# Patient Record
Sex: Male | Born: 1995 | Race: White | Hispanic: No | Marital: Single | Smoking: Never smoker
Health system: Southern US, Community
[De-identification: ages and names within clinical notes are randomized; demographics above are authoritative.]

## PROBLEM LIST (undated history)

## (undated) DIAGNOSIS — J939 Pneumothorax, unspecified: Secondary | ICD-10-CM

## (undated) DIAGNOSIS — D6851 Activated protein C resistance: Secondary | ICD-10-CM

## (undated) HISTORY — PX: APPENDECTOMY: SHX54

---
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
## 2018-01-27 ENCOUNTER — Observation Stay (HOSPITAL_COMMUNITY)
Admission: EM | Admit: 2018-01-27 | Discharge: 2018-01-29 | Disposition: A | Discharge status: Home / Self Care | Payer: 59 | Attending: Internal Medicine | Admitting: Internal Medicine

## 2018-01-27 ENCOUNTER — Other Ambulatory Visit: Payer: Self-pay

## 2018-01-27 ENCOUNTER — Encounter (HOSPITAL_COMMUNITY): Payer: Self-pay | Admitting: Emergency Medicine

## 2018-01-27 ENCOUNTER — Emergency Department (HOSPITAL_COMMUNITY): Payer: 59

## 2018-01-27 DIAGNOSIS — J9383 Other pneumothorax: Secondary | ICD-10-CM | POA: Diagnosis present

## 2018-01-27 DIAGNOSIS — D6851 Activated protein C resistance: Secondary | ICD-10-CM | POA: Diagnosis not present

## 2018-01-27 DIAGNOSIS — J9311 Primary spontaneous pneumothorax: Principal | ICD-10-CM | POA: Insufficient documentation

## 2018-01-27 HISTORY — DX: Pneumothorax, unspecified: J93.9

## 2018-01-27 HISTORY — DX: Activated protein C resistance: D68.51

## 2018-01-27 NOTE — ED Triage Notes (Signed)
C/o SOB and R sided back pain x 1 hour.  Pt had spontaneous pneumothorax August 13th and states symptoms feel the same.

## 2018-01-28 ENCOUNTER — Observation Stay (HOSPITAL_COMMUNITY): Payer: 59

## 2018-01-28 ENCOUNTER — Encounter (HOSPITAL_COMMUNITY): Payer: Self-pay | Admitting: Family Medicine

## 2018-01-28 ENCOUNTER — Other Ambulatory Visit: Payer: Self-pay

## 2018-01-28 DIAGNOSIS — J9311 Primary spontaneous pneumothorax: Secondary | ICD-10-CM

## 2018-01-28 DIAGNOSIS — D6851 Activated protein C resistance: Secondary | ICD-10-CM | POA: Insufficient documentation

## 2018-01-28 DIAGNOSIS — J939 Pneumothorax, unspecified: Secondary | ICD-10-CM | POA: Insufficient documentation

## 2018-01-28 LAB — CBC
HEMATOCRIT: 44.5 % (ref 39.0–52.0)
Hemoglobin: 14.5 g/dL (ref 13.0–17.0)
MCH: 29.6 pg (ref 26.0–34.0)
MCHC: 32.6 g/dL (ref 30.0–36.0)
MCV: 90.8 fL (ref 78.0–100.0)
Platelets: 332 10*3/uL (ref 150–400)
RBC: 4.9 MIL/uL (ref 4.22–5.81)
RDW: 12.2 % (ref 11.5–15.5)
WBC: 8.4 10*3/uL (ref 4.0–10.5)

## 2018-01-28 LAB — BASIC METABOLIC PANEL
Anion gap: 11 (ref 5–15)
BUN: 9 mg/dL (ref 6–20)
CALCIUM: 9.5 mg/dL (ref 8.9–10.3)
CO2: 24 mmol/L (ref 22–32)
Chloride: 107 mmol/L (ref 98–111)
Creatinine, Ser: 0.79 mg/dL (ref 0.61–1.24)
GFR calc non Af Amer: >60 mL/min (ref >60)
Glucose, Bld: 94 mg/dL (ref 70–99)
Potassium: 4.5 mmol/L (ref 3.5–5.1)
Sodium: 142 mmol/L (ref 135–145)

## 2018-01-28 LAB — HIV ANTIBODY (ROUTINE TESTING W REFLEX): HIV Screen 4th Generation wRfx: NONREACTIVE

## 2018-01-28 MED ORDER — SODIUM CHLORIDE 0.9% FLUSH
3.0000 mL | Freq: Two times a day (BID) | INTRAVENOUS | Status: DC
  Administered 2018-01-28 (×3): 3 mL via INTRAVENOUS

## 2018-01-28 MED ORDER — SODIUM CHLORIDE 0.9 % IV SOLN
INTRAVENOUS | Status: DC
  Administered 2018-01-28: 03:00:00 via INTRAVENOUS

## 2018-01-28 MED ORDER — ACETAMINOPHEN 650 MG RE SUPP: 650.0000 mg | Freq: Four times a day (QID) | RECTAL | Status: DC | PRN

## 2018-01-28 MED ORDER — ONDANSETRON HCL 4 MG PO TABS: 4.0000 mg | ORAL_TABLET | Freq: Four times a day (QID) | ORAL | Status: DC | PRN

## 2018-01-28 MED ORDER — HYDROCODONE-ACETAMINOPHEN 5-325 MG PO TABS: 1.0000 | ORAL_TABLET | ORAL | Status: DC | PRN

## 2018-01-28 MED ORDER — ACETAMINOPHEN 325 MG PO TABS: 650.0000 mg | ORAL_TABLET | Freq: Four times a day (QID) | ORAL | Status: DC | PRN

## 2018-01-28 MED ORDER — SENNOSIDES-DOCUSATE SODIUM 8.6-50 MG PO TABS: 1.0000 | ORAL_TABLET | Freq: Every evening | ORAL | Status: DC | PRN

## 2018-01-28 MED ORDER — ONDANSETRON HCL 4 MG/2ML IJ SOLN: 4.0000 mg | Freq: Four times a day (QID) | INTRAMUSCULAR | Status: DC | PRN

## 2018-01-28 NOTE — ED Provider Notes (Signed)
[facility] Provider Note   CSN: [id number] Arrival date & time: 01/27/18  2301     History   Chief Complaint Chief Complaint  Patient presents with  . Shortness of Breath    HPI Gavin Hanson is a 22 y.o. male.  Patient presents to the emergency department for sudden right-sided chest pain and shortness of breath.  Patient reports that he was admitted to the hospital in [place] on August 14 for spontaneous pneumothorax.  He reports that he had a chest tube for 4 or 5 days before he was discharged.  He tells me that his pneumothorax had nearly resolved at time of discharge, was approximately 5%.     Past Medical History:  Diagnosis Date  . Pneumothorax     There are no active problems to display for this patient.   Past Surgical History:  Procedure Laterality Date  . APPENDECTOMY          Home Medications    Prior to Admission medications   Not on File    Family History No family history on file.  Social History Social History   Tobacco Use  . Smoking status: Never Smoker  . Smokeless tobacco: Never Used  Substance Use Topics  . Alcohol use: Yes  . Drug use: Never     Allergies   Patient has no allergy information on record.   Review of Systems Review of Systems  Respiratory: Positive for shortness of breath.   Cardiovascular: Positive for chest pain.  All other systems reviewed and are negative.    Physical Exam Updated Vital Signs BP (!) 127/92   Pulse 76   Temp 98.2 F (36.8 C) (Oral)   Resp 18   SpO2 100%   Physical Exam  Constitutional: He is oriented to person, place, and time. He appears well-developed and well-nourished. No distress.  HENT:  Head: Normocephalic and atraumatic.  Right Ear: Hearing normal.  Left Ear: Hearing normal.  Nose: Nose normal.  Mouth/Throat: Oropharynx is clear and moist and mucous membranes are normal.  Eyes: Pupils are equal, round, and reactive to  light. Conjunctivae and EOM are normal.  Neck: Normal range of motion. Neck supple.  Cardiovascular: Regular rhythm, S1 normal and S2 normal. Exam reveals no gallop and no friction rub.  No murmur heard. Pulmonary/Chest: Effort normal and breath sounds normal. No respiratory distress. He exhibits no tenderness.  Abdominal: Soft. Normal appearance and bowel sounds are normal. There is no hepatosplenomegaly. There is no tenderness. There is no rebound, no guarding, no tenderness at McBurney's point and negative Murphy's sign. No hernia.  Musculoskeletal: Normal range of motion.  Neurological: He is alert and oriented to person, place, and time. He has normal strength. No cranial nerve deficit or sensory deficit. Coordination normal. GCS eye subscore is 4. GCS verbal subscore is 5. GCS motor subscore is 6.  Skin: Skin is warm, dry and intact. No rash noted. No cyanosis.  Psychiatric: He has a normal mood and affect. His speech is normal and behavior is normal. Thought content normal.  Nursing note and vitals reviewed.    ED Treatments / Results  Labs (all labs ordered are listed, but only abnormal results are displayed) Labs Reviewed - No data to display  EKG None  Radiology Dg Chest 2 View  Result Date: 01/27/2018 CLINICAL DATA:  Patient with shortness of breath and right-sided chest pain. History of spontaneous right pneumothorax. EXAM: CHEST - 2 VIEW COMPARISON:  None. FINDINGS:  Normal cardiac and mediastinal contours. No consolidative pulmonary opacities. No pleural effusion. Moderate right apical pneumothorax. Thoracic spine is unremarkable. IMPRESSION: Moderate right apical pneumothorax. Critical Value/emergent results were called by telephone at the time of interpretation on 01/27/2018 at 11:58 pm to Dr. Azalia Bilis , who verbally acknowledged these results. Electronically Signed   By: Annia Belt M.D.   On: 01/27/2018 23:59    Procedures Procedures (including critical care  time)  Medications Ordered in ED Medications - No data to display   Initial Impression / Assessment and Plan / ED Course  I have reviewed the triage vital signs and the nursing notes.  Pertinent labs & imaging results that were available during my care of the patient were reviewed by me and considered in my medical decision making (see chart for details).     Patient presents with sudden right-sided chest pain and shortness of breath.  He appears comfortable, vital signs are normal.  Normal oxygenation.  Chest x-ray shows moderate right apical pneumothorax.  This seems to be a sudden increase from his discharge, however, cannot currently see records through care everywhere.  This was discussed with Dr. Cornelius Moras, on-call for cardiothoracic surgery.  He has reviewed the x-ray, does not think the patient needs a pneumothorax at this time.  Based on the recent history, however, recommends hospitalization on the hospitalist service and repeat chest x-ray in the morning.  Dr. Cornelius Moras will follow.  Final Clinical Impressions(s) / ED Diagnoses   Final diagnoses:  Spontaneous pneumothorax    ED Discharge Orders    None       Pollina, Canary Brim, MD 01/28/18 [phone]

## 2018-01-28 NOTE — Consult Note (Signed)
[address]       [place] [address]             [phone]          CARDIOTHORACIC SURGERY CONSULTATION REPORT  PCP is Patient, No Pcp Per Referring Provider is Odie Sera, MD   Reason for consultation:  Spontaneous pneumothorax  HPI:  Patient is a 22 year old male with medical history notable for factor V Leiden who has been referred for evaluation of recurrent/persistent spontaneous pneumothorax.  Patient was hospitalized in [place] 2 weeks ago with large right primary spontaneous pneumothorax.  There was no previous history of spontaneous pneumothorax and no preceding history of trauma.  A chest tube was placed and the patient hospitalized for several days.  The chest tube was removed uneventfully although according to the patient's mother there was residual 10% pneumothorax.  The patient was seen in follow-up last week where a small pneumothorax persisted but was reportedly improved.  The patient was traveling back to [place] where he attends [employer] when he developed recurrent symptoms of chest pain and mild shortness of breath.  He presented to the emergency department where chest x-ray revealed small pneumothorax.  The patient was hospitalized and cardiothoracic surgical consultation was requested.  Patient reports that he already feels better.  He denies any current symptoms of chest discomfort or shortness of breath.  He is hungry.  Past Medical History:  Diagnosis Date  . Factor V Leiden (HCC)   . Pneumothorax     Past Surgical History:  Procedure Laterality Date  . APPENDECTOMY      Family History  Problem Relation Age of Onset  . Factor V Leiden deficiency Father     Social History   Socioeconomic History  . Marital status: Single    Spouse name: Not on file  . Number of children: Not on file  . Years of education: Not on file  . Highest education level: Not on file  Occupational History  . Not on file    Social Needs  . Financial resource strain: Not on file  . Food insecurity:    Worry: Not on file    Inability: Not on file  . Transportation needs:    Medical: Not on file    Non-medical: Not on file  Tobacco Use  . Smoking status: Never Smoker  . Smokeless tobacco: Never Used  Substance and Sexual Activity  . Alcohol use: Yes  . Drug use: Never  . Sexual activity: Not on file  Lifestyle  . Physical activity:    Days per week: Not on file    Minutes per session: Not on file  . Stress: Not on file  Relationships  . Social connections:    Talks on phone: Not on file    Gets together: Not on file    Attends religious service: Not on file    Active member of club or organization: Not on file    Attends meetings of clubs or organizations: Not on file    Relationship status: Not on file  . Intimate partner violence:    Fear of current or ex partner: Not on file    Emotionally abused: Not on file    Physically abused: Not on file    Forced sexual activity: Not on file  Other Topics Concern  . Not on file  Social History Narrative  . Not on file    Prior to Admission medications  Not on File    Current Facility-Administered Medications  Medication Dose Route Frequency Provider Last Rate Last Dose  . acetaminophen (TYLENOL) tablet 650 mg  650 mg Oral Q6H PRN Opyd, Lavone Neri, MD       Or  . acetaminophen (TYLENOL) suppository 650 mg  650 mg Rectal Q6H PRN Opyd, Lavone Neri, MD      . HYDROcodone-acetaminophen (NORCO/VICODIN) 5-325 MG per tablet 1-2 tablet  1-2 tablet Oral Q4H PRN Opyd, Lavone Neri, MD      . ondansetron (ZOFRAN) tablet 4 mg  4 mg Oral Q6H PRN Opyd, Lavone Neri, MD       Or  . ondansetron (ZOFRAN) injection 4 mg  4 mg Intravenous Q6H PRN Opyd, Lavone Neri, MD      . senna-docusate (Senokot-S) tablet 1 tablet  1 tablet Oral QHS PRN Opyd, Lavone Neri, MD      . sodium chloride flush (NS) 0.9 % injection 3 mL  3 mL Intravenous Q12H Opyd, Lavone Neri, MD   3 mL at  01/28/18 0945    No Known Allergies    Review of Systems:  Per HPI - remainder non-contributory     Physical Exam:   BP 116/88 (BP Location: Right Arm)   Pulse (!) 55   Temp 97.6 F (36.4 C) (Oral)   Resp 20   Ht 6\' 1"  (1.854 m)   Wt 60.8 kg   SpO2 100%   BMI 17.68 kg/m   General:  Thin,  well-appearing  HEENT:  Unremarkable   Neck:   no JVD, no bruits, no adenopathy   Chest:   clear to auscultation, slightly diminished breath sounds on right side, no wheezes, no rhonchi   CV:   RRR, no  murmur   Abdomen:  soft, non-tender, no masses   Extremities:  warm, well-perfused, pulses palpable, no lower extremity edema  Rectal/GU  Deferred  Neuro:   Grossly non-focal and symmetrical throughout  Skin:   Clean and dry, no rashes, no breakdown  Diagnostic Tests:  Lab Results: Recent Labs    01/28/18 0100  WBC 8.4  HGB 14.5  HCT 44.5  PLT 332   BMET:  Recent Labs    01/28/18 0100  NA 142  K 4.5  CL 107  CO2 24  GLUCOSE 94  BUN 9  CREATININE 0.79  CALCIUM 9.5    CBG (last 3)  No results for input(s): GLUCAP in the last 72 hours. PT/INR:  No results for input(s): LABPROT, INR in the last 72 hours.  CXR:  CHEST - 2 VIEW  COMPARISON:  None.  FINDINGS: Normal cardiac and mediastinal contours. No consolidative pulmonary opacities. No pleural effusion. Moderate right apical pneumothorax. Thoracic spine is unremarkable.  IMPRESSION: Moderate right apical pneumothorax.  Critical Value/emergent results were called by telephone at the time of interpretation on 01/27/2018 at 11:58 pm to Dr. Azalia Bilis , who verbally acknowledged these results.   Electronically Signed   By: Annia Belt M.D.   On: 01/27/2018 23:59  CHEST - 2 VIEW  COMPARISON:  Yesterday  FINDINGS: Small right apical pneumothorax with mild increase from prior. At the apex craniocaudal span was previously 27 mm and is today 35 mm. Gas also extends more laterally. There is no  shift. The lungs are clear.  IMPRESSION: Mild increase in small right apical pneumothorax.   Electronically Signed   By: Marnee Spring M.D.   On: 01/28/2018 08:14   Impression:  Recurrent/persistent right primary spontaneous  pneumothorax.  I have personally reviewed the patient's admission chest x-ray and a repeat x-ray performed this morning.  There is a small right pneumothorax without any complicating features.  In and of itself this pneumothorax is not large enough to mandate surgical intervention at this time.  I am not convinced that the pneumothorax has changed in size overnight, as the difference between the 2 films appears likely related to depth of inspiration.  It is unclear whether or not the patient's pneumothorax represents persistence of the his previous spontaneous pneumothorax versus a recurrence, although according to the mother the patient had a follow-up x-ray performed earlier last week that showed a persistent small right pneumothorax.   Plan:  I discussed options at length with the patient and his mother at the bedside this morning.  Options at this time include continued observation versus chest tube placement versus definitive surgical management with video-assisted thoracoscopic surgery for bleb resection and pleurodesis.  I think it would be a bit aggressive to proceed with surgery at this time and chest tube placement does not appear necessary.  I favor continued observation.  If the patient's pneumothorax increases some type of intervention will be required.  The patient's circumstances are further complicated by the fact that he and his family live in [place] he is currently [place] in [place] at [employer].    We plan repeat chest x-ray tomorrow morning.  If x-ray tomorrow remains stable I think it would be reasonable to discharge the patient with plans for follow-up chest x-ray sometime later in the week.  The patient  understands that he should not be driving an automobile and he should not take part in any type of strenuous physical exertion for at least 2 more weeks no matter how good he feels.  All questions answered.  I spent in excess of 60 minutes during the conduct of this hospital consultation and >50% of this time involved direct face-to-face encounter for counseling and/or coordination of the patient's care.    Salvatore Decentlarence H. Cornelius Moraswen, MD 01/28/2018 11:39 AM

## 2018-01-28 NOTE — Progress Notes (Signed)
Patient admitted after midnight, please see H&P.  2nd pneumothorax in last few weeks.  ? marfans-- long thorax, tall/lanky.  Never had echo or other work up.  Xray today not improved-- await CVTS evaluation and recommendations.   No pain, no SOB.   Marlin CanaryJessica Merlie Noga DO

## 2018-01-28 NOTE — ED Notes (Signed)
ED Provider at bedside. 

## 2018-01-28 NOTE — H&P (Signed)
History and Physical    Ivy Meriwether ZOX:[id number] DOB: 01/05/1996 DOA: 01/27/2018  PCP: No primary care provider on file.   Patient coming from: Home   Chief Complaint: Acute chest pain and SOB   HPI: Gavin Hanson is a 22 y.o. male with medical history significant for factor V Leiden, now presenting to the emergency department for evaluation of acute onset of right-sided chest pain and shortness of breath.  Patient reports that he was admitted to hospital in [place] approximately 2 weeks ago with his first spontaneous pneumothorax, had chest tube placed for 4 to 5 days, and was discharged home in his usual condition with an approximate 5% residual pneumothorax.  He had continued to feel well at home with no recent coughing, and was seated at rest when he developed acute onset of pain in the right chest and shortness of breath, similar to symptoms that he experienced with the initial pneumothorax 2 weeks ago.  He denies any smoking history and denies history of asthma.  He has not been coughing or vomiting.  Reports that he usually enjoys good health and does not take any medications.  ED Course: Upon arrival to the ED, patient is found to be afebrile, saturating well on room air, and with vitals otherwise normal.  EKG features normal sinus rhythm and chemistry panel is unremarkable.  Chest x-ray reveals a moderate apical right-sided pneumothorax.  CT surgery was consulted by the ED physician and recommended a medical admission with repeat x-ray in the morning, but no aspiration or chest tube at this time.  Review of Systems:  All other systems reviewed and apart from HPI, are negative.  Past Medical History:  Diagnosis Date  . Factor V Leiden (HCC)   . Pneumothorax     Past Surgical History:  Procedure Laterality Date  . APPENDECTOMY       reports that he has never smoked. He has never used smokeless tobacco. He reports that he drinks alcohol. He reports that he does not use  drugs.  No Known Allergies  Family History  Problem Relation Age of Onset  . Factor V Leiden deficiency Father      Prior to Admission medications   Not on File    Physical Exam: Vitals:   01/27/18 2306 01/28/18 0015 01/28/18 0045 01/28/18 0135  BP: 124/87 (!) 127/92 118/81 116/88  Pulse: 90 76 65 (!) 55  Resp: 16 18 18 20   Temp: 98.2 F (36.8 C)   97.6 F (36.4 C)  TempSrc: Oral   Oral  SpO2: 100% 100% 100% 100%  Weight:    60.8 kg  Height:    6\' 1"  (1.854 m)      Constitutional: NAD, calm  Eyes: PERTLA, lids and conjunctivae normal ENMT: Mucous membranes are moist. Posterior pharynx clear of any exudate or lesions.   Neck: normal, supple, no masses, no thyromegaly Respiratory: Diminished on right, no wheezing, no crackles. Normal respiratory effort.    Cardiovascular: S1 & S2 heard, regular rate and rhythm,. No extremity edema.  Abdomen: No distension, no tenderness, soft. Bowel sounds normal.  Musculoskeletal: no clubbing / cyanosis. No joint deformity upper and lower extremities.    Skin: no significant rashes, lesions, ulcers. Warm, dry, well-perfused. Neurologic: CN 2-12 grossly intact. Sensation intact. Strength 5/5 in all 4 limbs.  Psychiatric: Alert and oriented x 3. Calm, cooperative.     Labs on Admission: I have personally reviewed following labs and imaging studies  CBC: No results for input(s): WBC,  NEUTROABS, HGB, HCT, MCV, PLT in the last 168 hours. Basic Metabolic Panel: Recent Labs  Lab 01/28/18 0100  NA 142  K 4.5  CL 107  CO2 24  GLUCOSE 94  BUN 9  CREATININE 0.79  CALCIUM 9.5   GFR: Estimated Creatinine Clearance: 125.6 mL/min (by C-G formula based on SCr of 0.79 mg/dL). Liver Function Tests: No results for input(s): AST, ALT, ALKPHOS, BILITOT, PROT, ALBUMIN in the last 168 hours. No results for input(s): LIPASE, AMYLASE in the last 168 hours. No results for input(s): AMMONIA in the last 168 hours. Coagulation Profile: No  results for input(s): INR, PROTIME in the last 168 hours. Cardiac Enzymes: No results for input(s): CKTOTAL, CKMB, CKMBINDEX, TROPONINI in the last 168 hours. BNP (last 3 results) No results for input(s): PROBNP in the last 8760 hours. HbA1C: No results for input(s): HGBA1C in the last 72 hours. CBG: No results for input(s): GLUCAP in the last 168 hours. Lipid Profile: No results for input(s): CHOL, HDL, LDLCALC, TRIG, CHOLHDL, LDLDIRECT in the last 72 hours. Thyroid Function Tests: No results for input(s): TSH, T4TOTAL, FREET4, T3FREE, THYROIDAB in the last 72 hours. Anemia Panel: No results for input(s): VITAMINB12, FOLATE, FERRITIN, TIBC, IRON, RETICCTPCT in the last 72 hours. Urine analysis: No results found for: COLORURINE, APPEARANCEUR, LABSPEC, PHURINE, GLUCOSEU, HGBUR, BILIRUBINUR, KETONESUR, PROTEINUR, UROBILINOGEN, NITRITE, LEUKOCYTESUR Sepsis Labs: @LABRCNTIP (procalcitonin:4,lacticidven:4) )No results found for this or any previous visit (from the past 240 hour(s)).   Radiological Exams on Admission: Dg Chest 2 View  Result Date: 01/27/2018 CLINICAL DATA:  Patient with shortness of breath and right-sided chest pain. History of spontaneous right pneumothorax. EXAM: CHEST - 2 VIEW COMPARISON:  None. FINDINGS: Normal cardiac and mediastinal contours. No consolidative pulmonary opacities. No pleural effusion. Moderate right apical pneumothorax. Thoracic spine is unremarkable. IMPRESSION: Moderate right apical pneumothorax. Critical Value/emergent results were called by telephone at the time of interpretation on 01/27/2018 at 11:58 pm to Dr. Azalia BilisKEVIN CAMPOS , who verbally acknowledged these results. Electronically Signed   By: Annia Beltrew  Davis M.D.   On: 01/27/2018 23:59    EKG: Independently reviewed. Sinus rhythm, non-specific ST abnormalities, no priors available.   Assessment/Plan   1. Spontaneous pneumothorax  - Presents with acute-onset of right-sided CP and SOB while at rest and  is found to have moderate right apical pneumothorax on CXR  - He had been admitted to a hospital in [place] 2 wks ago for the same, reports having chest tube in for 4 or 5 days and being discharged with 5% residual PTX   - He has remained stable with normal respiratory and heart rate, normal BP, no hypoxia, and speaking full sentences  - CTS is consulting and much appreciated  - Repeat 2v CXR in am, continue supportive care    DVT prophylaxis: SCD's  Code Status: Full  Family Communication: Discussed with patient  Consults called: CTS  Admission status: Observation     Briscoe Deutscherimothy S Opyd, MD [facility] Pager [phone]  If 7PM-7AM, please contact night-coverage [email or web address] Password [email or web address]  01/28/2018, 2:12 AM

## 2018-01-29 ENCOUNTER — Observation Stay (HOSPITAL_COMMUNITY): Payer: 59

## 2018-01-29 DIAGNOSIS — J9311 Primary spontaneous pneumothorax: Secondary | ICD-10-CM | POA: Diagnosis not present

## 2018-01-29 NOTE — Progress Notes (Signed)
      [address]       [place] [address]             [phone]     CARDIOTHORACIC SURGERY PROGRESS NOTE  Subjective: No complaints.  No SOB.  Mild right sided chest discomfort with deep inspiration  Objective: Vital signs in last 24 hours: Temp:  [97.8 F (36.6 C)-98.3 F (36.8 C)] 97.8 F (36.6 C) (08/31 2209) Pulse Rate:  [44-67] 44 (08/31 2209) Cardiac Rhythm: Normal sinus rhythm (08/31 1951) Resp:  [16] 16 (08/31 1749) BP: (93-101)/(55-61) 93/55 (08/31 2209) SpO2:  [98 %-99 %] 98 % (08/31 2209)  Physical Exam:  Rhythm:   sinus  Breath sounds: Clear, slightly diminished on right side  Heart sounds:  RRR  Incisions:  n/a  Abdomen:  soft  Extremities:  warm   Intake/Output from previous day: 08/31 0701 - 09/01 0700 In: 3 [I.V.:3] Out: -  Intake/Output this shift: No intake/output data recorded.  Lab Results: Recent Labs    01/28/18 0100  WBC 8.4  HGB 14.5  HCT 44.5  PLT 332   BMET:  Recent Labs    01/28/18 0100  NA 142  K 4.5  CL 107  CO2 24  GLUCOSE 94  BUN 9  CREATININE 0.79  CALCIUM 9.5    CBG (last 3)  No results for input(s): GLUCAP in the last 72 hours. PT/INR:  No results for input(s): LABPROT, INR in the last 72 hours.  CXR:  CHEST - 2 VIEW  COMPARISON:  Chest radiograph dated 01/28/2018  FINDINGS: No significant interval change in the size of the right apical pneumothorax compared to the earlier radiograph. There is however additional pneumothorax involving the inferior right lung and along the right hemidiaphragm which is new or larger since the prior radiograph. No pneumothorax on the left. There is no interval shift of the mediastinum. No focal consolidation. No acute osseous pathology.  IMPRESSION: 1. Similar size right apical pneumothorax. 2. Right lung base pneumothorax along the right hemidiaphragm appears larger or more conspicuous compared to the earlier radiograph.  Electronically  Signed: By: Elgie Collard M.D. On: 01/29/2018 05:46   Assessment/Plan:   Essentially no change in small persistent/recurrent right pneumothorax.  Options again discussed at length with patient and his mother at the bedside.  I recommend continued observation without any type of surgical intervention at this time, but I quoted them a 50% likelihood that the pneumothorax might enlarge further and ultimately require intervention.  Given the patient's stability since presentation 2 days ago, I think it is reasonable to let him go home as long as he can be followed closely as an outpatient.  I recommend repeat CXR in 2-3 days or sooner if symptoms worsen.  The patient should not be driving an automobile and should be taking it easy with absolutely no strenuous activity of any kind.  Under the circumstances, I think it makes the most sense for the patient to go back home to [place] where he could follow up this week with the thoracic surgeon he saw previously.  All questions answered.   I spent in excess of 30 minutes during the conduct of this hospital encounter and >50% of this time involved direct face-to-face encounter with the patient for counseling and/or coordination of their care.    Gavin Nails, MD 01/29/2018 7:32 AM

## 2018-01-29 NOTE — Discharge Summary (Signed)
Physician Discharge Summary  Gavin Hanson ZOX:[id number] DOB: Oct 15, 1995 DOA: 01/27/2018  PCP: Patient, No Pcp Per  Admit date: 01/27/2018 Discharge date: 01/29/2018  Admitted From: home Discharge disposition: home   Recommendations for Outpatient Follow-Up:   recommend repeat CXR in 2-3 days or sooner if symptoms worsen.  The patient should not be driving an automobile and should be taking it easy with absolutely no strenuous activity of any kind.  Under the circumstances, I think it makes the most sense for the patient to go back home to [place] where he could follow up this week with the thoracic surgeon he saw previously. -? Marfans work up -? Alpha 1 antitrypsin   Discharge Diagnosis:   Principal Problem:   Primary spontaneous pneumothorax    Discharge Condition: Improved.  Diet recommendation: Regular.  Wound care: None.  Code status: Full.   History of Present Illness:   Gavin Hanson is a 22 y.o. male with medical history significant for factor V Leiden, now presenting to the emergency department for evaluation of acute onset of right-sided chest pain and shortness of breath.  Patient reports that he was admitted to hospital in [place] approximately 2 weeks ago with his first spontaneous pneumothorax, had chest tube placed for 4 to 5 days, and was discharged home in his usual condition with an approximate 5% residual pneumothorax.  He had continued to feel well at home with no recent coughing, and was seated at rest when he developed acute onset of pain in the right chest and shortness of breath, similar to symptoms that he experienced with the initial pneumothorax 2 weeks ago.  He denies any smoking history and denies history of asthma.  He has not been coughing or vomiting.  Reports that he usually enjoys good health and does not take any medications.   Hospital Course by Problem:   Per CVTSCornelius Moras Essentially no change in small persistent/recurrent  right pneumothorax.  Options again discussed at length with patient and his mother at the bedside.  I recommend continued observation without any type of surgical intervention at this time, but I quoted them a 50% likelihood that the pneumothorax might enlarge further and ultimately require intervention.  Given the patient's stability since presentation 2 days ago, I think it is reasonable to let him go home as long as he can be followed closely as an outpatient.  I recommend repeat CXR in 2-3 days or sooner if symptoms worsen.  The patient should not be driving an automobile and should be taking it easy with absolutely no strenuous activity of any kind.  Under the circumstances, I think it makes the most sense for the patient to go back home to [place] where he could follow up this week with the thoracic surgeon he saw previously.    Medical Consultants:    CVTS  Discharge Exam:   Vitals:   01/28/18 2209 01/29/18 0751  BP: (!) 93/55 (!) 95/58  Pulse: (!) 44 (!) 54  Resp:  16  Temp: 97.8 F (36.6 C) (!) 97.5 F (36.4 C)  SpO2: 98% 99%   Vitals:   01/28/18 0135 01/28/18 1749 01/28/18 2209 01/29/18 0751  BP: 116/88 101/61 (!) 93/55 (!) 95/58  Pulse: (!) 55 67 (!) 44 (!) 54  Resp: 20 16  16   Temp: 97.6 F (36.4 C) 98.3 F (36.8 C) 97.8 F (36.6 C) (!) 97.5 F (36.4 C)  TempSrc: Oral Oral Oral Oral  SpO2: 100% 99% 98% 99%  Weight: 60.8 kg     Height: 6\' 1"  (1.854 m)       General exam: Appears calm and comfortable. Anxious to go home  The results of significant diagnostics from this hospitalization (including imaging, microbiology, ancillary and laboratory) are listed below for reference.     Procedures and Diagnostic Studies:   Dg Chest 2 View  Result Date: 01/28/2018 CLINICAL DATA:  Spontaneous pneumothorax EXAM: CHEST - 2 VIEW COMPARISON:  Yesterday FINDINGS: Small right apical pneumothorax with mild increase from prior. At the apex craniocaudal span was previously 27  mm and is today 35 mm. Gas also extends more laterally. There is no shift. The lungs are clear. IMPRESSION: Mild increase in small right apical pneumothorax. Electronically Signed   By: Marnee Spring M.D.   On: 01/28/2018 08:14   Dg Chest 2 View  Result Date: 01/27/2018 CLINICAL DATA:  Patient with shortness of breath and right-sided chest pain. History of spontaneous right pneumothorax. EXAM: CHEST - 2 VIEW COMPARISON:  None. FINDINGS: Normal cardiac and mediastinal contours. No consolidative pulmonary opacities. No pleural effusion. Moderate right apical pneumothorax. Thoracic spine is unremarkable. IMPRESSION: Moderate right apical pneumothorax. Critical Value/emergent results were called by telephone at the time of interpretation on 01/27/2018 at 11:58 pm to Dr. Azalia Bilis , who verbally acknowledged these results. Electronically Signed   By: Annia Belt M.D.   On: 01/27/2018 23:59     Labs:   Basic Metabolic Panel: Recent Labs  Lab 01/28/18 0100  NA 142  K 4.5  CL 107  CO2 24  GLUCOSE 94  BUN 9  CREATININE 0.79  CALCIUM 9.5   GFR Estimated Creatinine Clearance: 125.6 mL/min (by C-G formula based on SCr of 0.79 mg/dL). Liver Function Tests: No results for input(s): AST, ALT, ALKPHOS, BILITOT, PROT, ALBUMIN in the last 168 hours. No results for input(s): LIPASE, AMYLASE in the last 168 hours. No results for input(s): AMMONIA in the last 168 hours. Coagulation profile No results for input(s): INR, PROTIME in the last 168 hours.  CBC: Recent Labs  Lab 01/28/18 0100  WBC 8.4  HGB 14.5  HCT 44.5  MCV 90.8  PLT 332   Cardiac Enzymes: No results for input(s): CKTOTAL, CKMB, CKMBINDEX, TROPONINI in the last 168 hours. BNP: Invalid input(s): POCBNP CBG: No results for input(s): GLUCAP in the last 168 hours. D-Dimer No results for input(s): DDIMER in the last 72 hours. Hgb A1c No results for input(s): HGBA1C in the last 72 hours. Lipid Profile No results for  input(s): CHOL, HDL, LDLCALC, TRIG, CHOLHDL, LDLDIRECT in the last 72 hours. Thyroid function studies No results for input(s): TSH, T4TOTAL, T3FREE, THYROIDAB in the last 72 hours.  Invalid input(s): FREET3 Anemia work up No results for input(s): VITAMINB12, FOLATE, FERRITIN, TIBC, IRON, RETICCTPCT in the last 72 hours. Microbiology No results found for this or any previous visit (from the past 240 hour(s)).   Discharge Instructions:   Discharge Instructions    Discharge instructions   Complete by:  As directed    recommend repeat CXR in 2-3 days or sooner if symptoms worsen.  The patient should not be driving an automobile and should be taking it easy with absolutely no strenuous activity of any kind.     Allergies as of 01/29/2018   No Known Allergies     Medication List    You have not been prescribed any medications.    Follow-up Information    follow up with chest x ray in  2 days with thoracic surgery Follow up.            Time coordinating discharge: 35 min  Signed:  Joseph Art  [facility] 01/29/2018, 3:49 PM

## 2019-01-28 IMAGING — DX DG CHEST 2V
2 series · 2 of 2 positions shown · non-contrast
Comparison: None.

CLINICAL DATA: Patient with shortness of breath and right-sided
chest pain. History of spontaneous right pneumothorax.

EXAM:
CHEST - 2 VIEW

[chest pa]
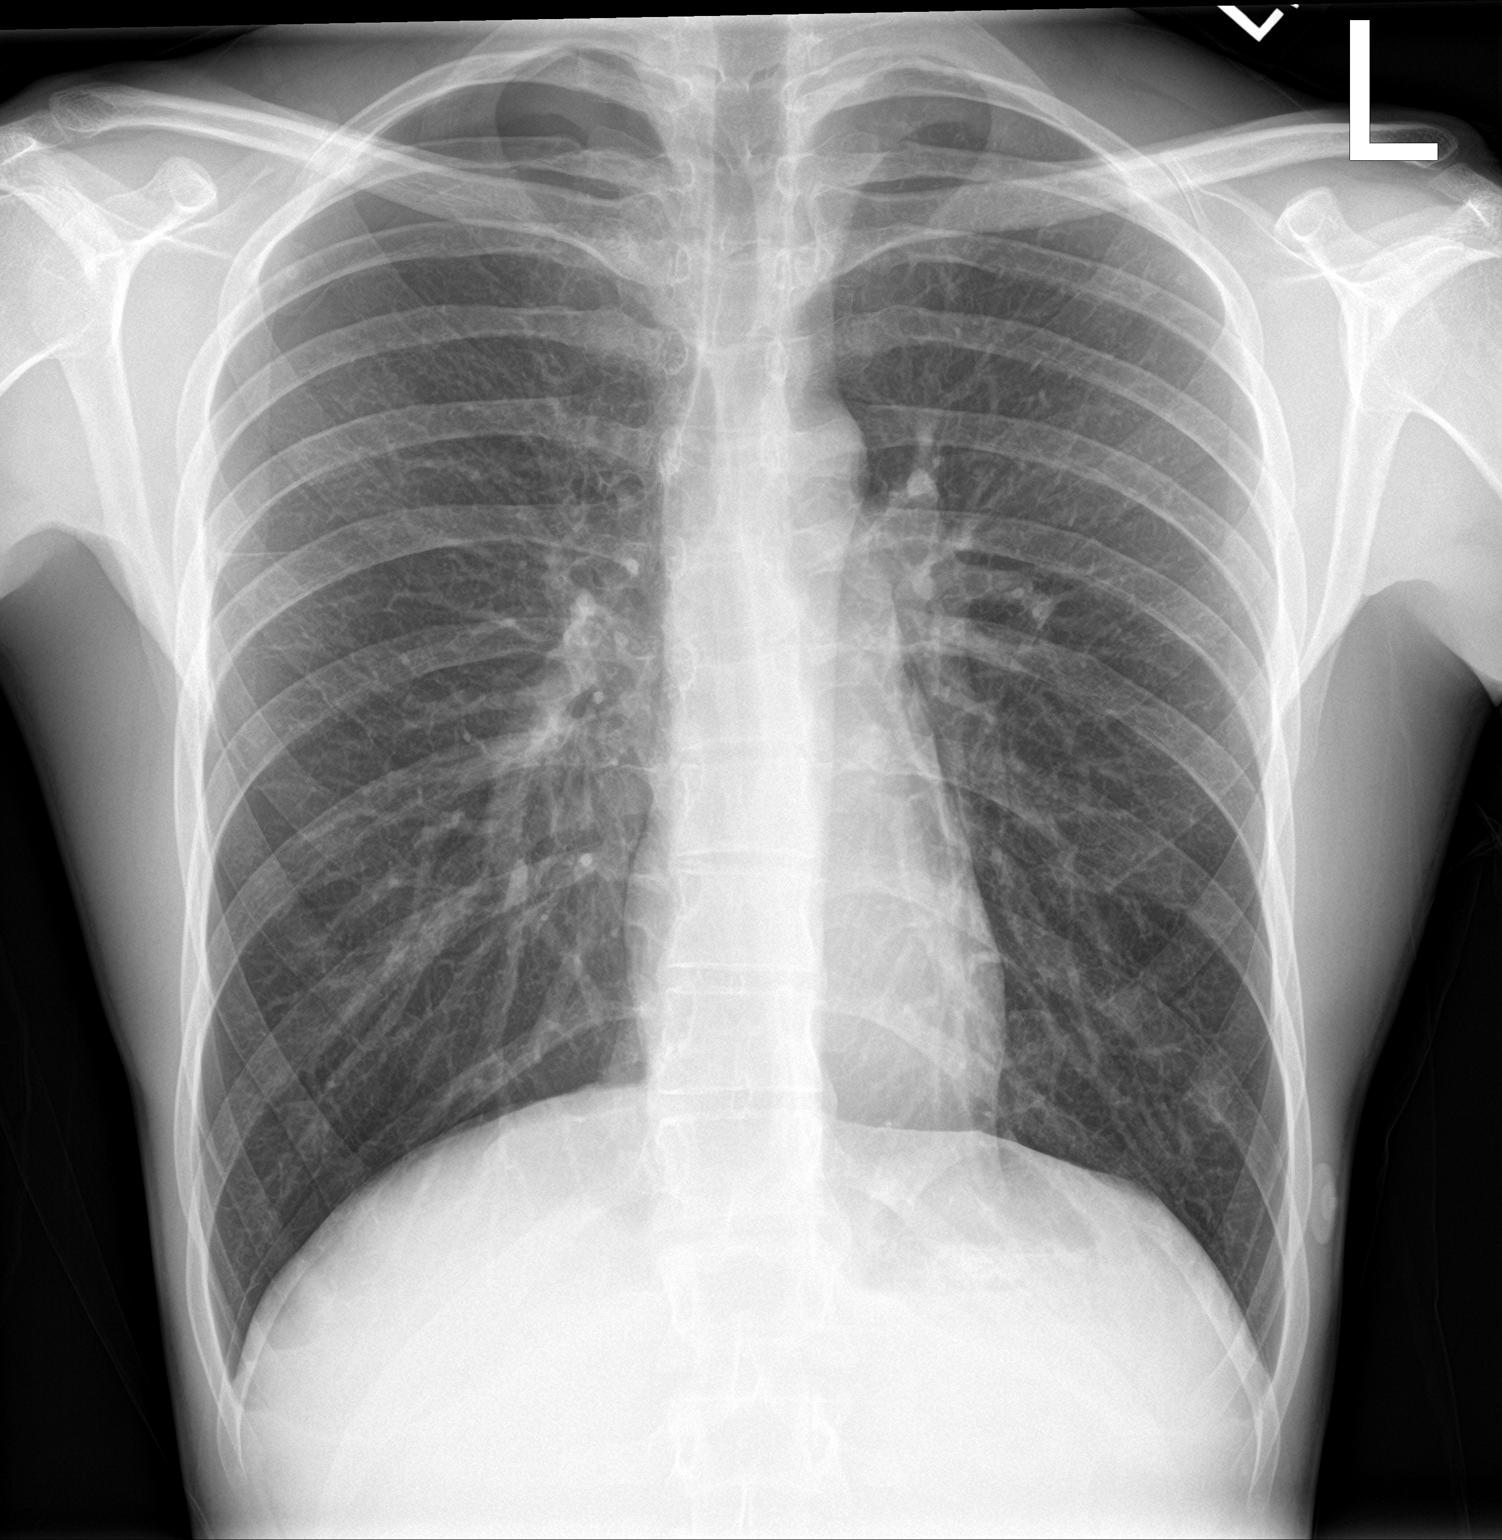

[chest lat]
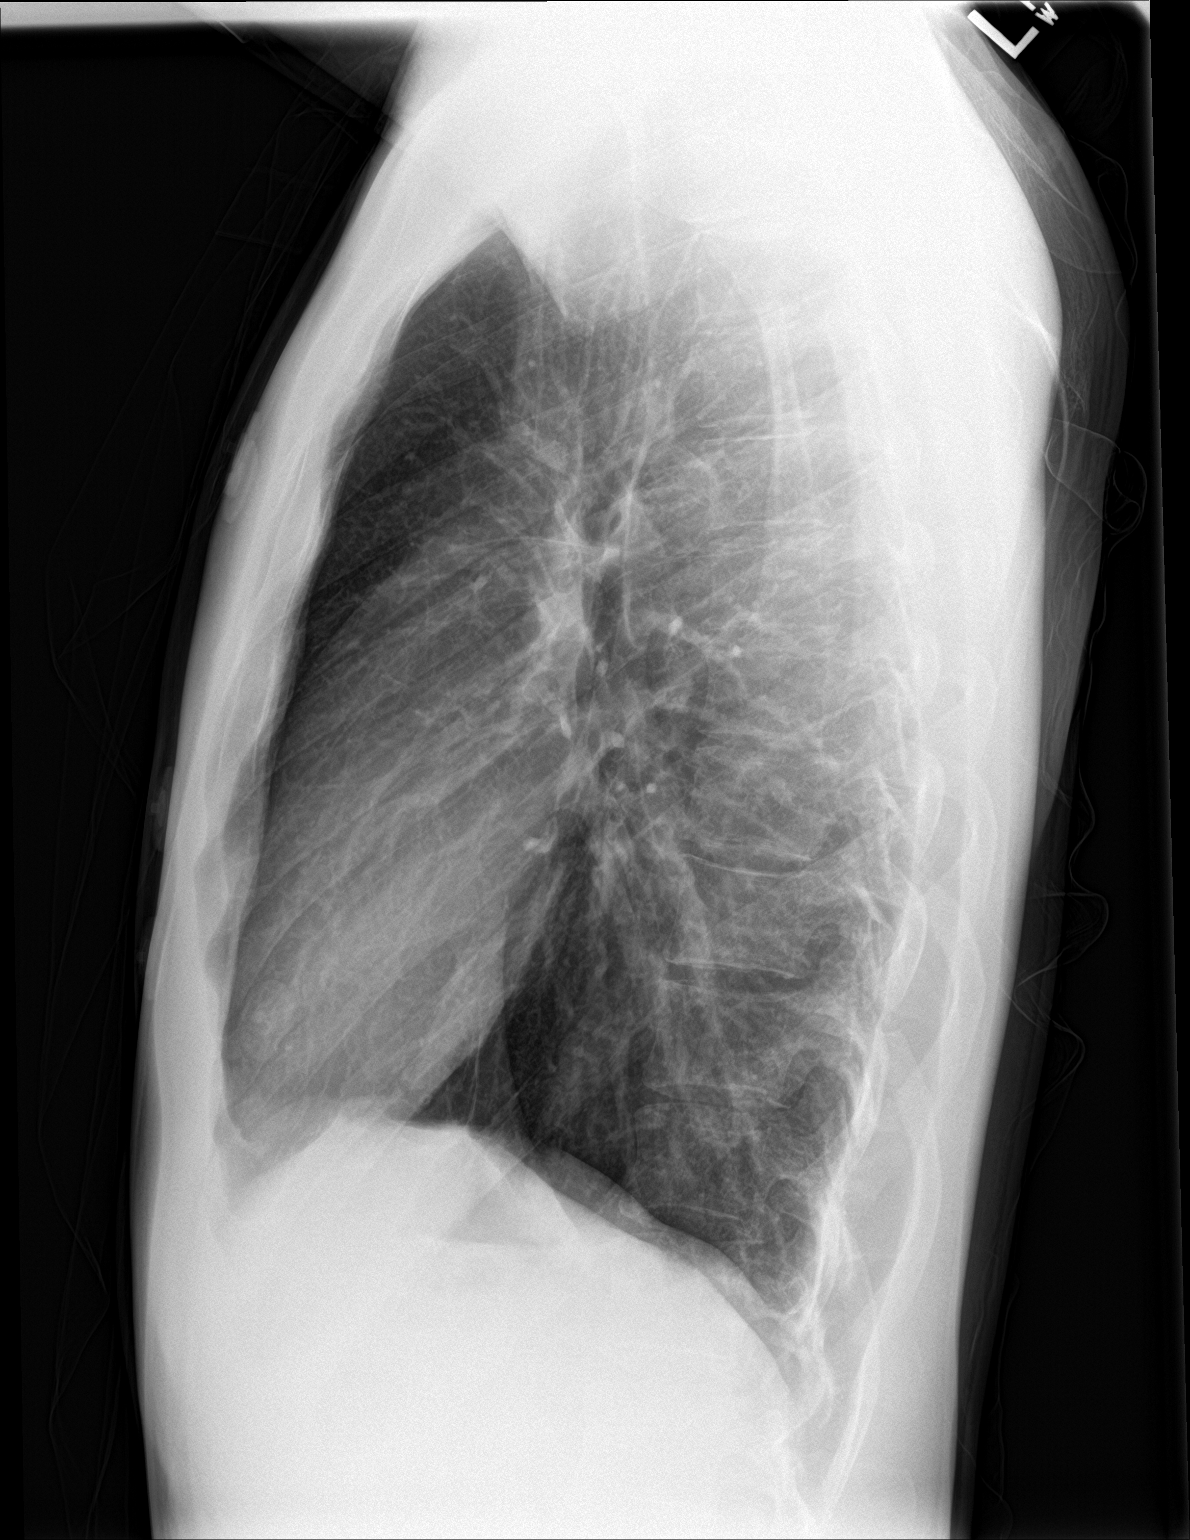

[2 of 2 positions shown; findings below may reference images not displayed]

FINDINGS: Normal cardiac and mediastinal contours. No consolidative pulmonary
opacities. No pleural effusion. Moderate right apical pneumothorax.
Thoracic spine is unremarkable.
IMPRESSION: Moderate right apical pneumothorax.

Critical Value/emergent results were called by telephone at the time
of interpretation on 01/27/2018 at [DATE] to Dr. AKATA CALEB , who
verbally acknowledged these results.
